# Patient Record
Sex: Male | Born: 1960 | Race: Black or African American | Hispanic: No | Marital: Married | Smoking: Current every day smoker
Health system: Southern US, Community
[De-identification: ages and names within clinical notes are randomized; demographics above are authoritative.]

---

## 2011-07-17 ENCOUNTER — Encounter (HOSPITAL_COMMUNITY): Payer: Self-pay | Admitting: Emergency Medicine

## 2011-07-17 ENCOUNTER — Emergency Department (HOSPITAL_COMMUNITY)
Admission: EM | Admit: 2011-07-17 | Discharge: 2011-07-17 | Disposition: A | Discharge status: Home / Self Care | Payer: Self-pay | Attending: Emergency Medicine | Admitting: Emergency Medicine

## 2011-07-17 ENCOUNTER — Emergency Department (HOSPITAL_COMMUNITY): Payer: Self-pay

## 2011-07-17 DIAGNOSIS — J3489 Other specified disorders of nose and nasal sinuses: Secondary | ICD-10-CM | POA: Insufficient documentation

## 2011-07-17 DIAGNOSIS — F17200 Nicotine dependence, unspecified, uncomplicated: Secondary | ICD-10-CM | POA: Insufficient documentation

## 2011-07-17 DIAGNOSIS — R918 Other nonspecific abnormal finding of lung field: Secondary | ICD-10-CM | POA: Insufficient documentation

## 2011-07-17 DIAGNOSIS — R5383 Other fatigue: Secondary | ICD-10-CM | POA: Insufficient documentation

## 2011-07-17 DIAGNOSIS — R059 Cough, unspecified: Secondary | ICD-10-CM | POA: Insufficient documentation

## 2011-07-17 DIAGNOSIS — R5381 Other malaise: Secondary | ICD-10-CM | POA: Insufficient documentation

## 2011-07-17 DIAGNOSIS — R05 Cough: Secondary | ICD-10-CM | POA: Insufficient documentation

## 2011-07-17 DIAGNOSIS — J189 Pneumonia, unspecified organism: Secondary | ICD-10-CM | POA: Insufficient documentation

## 2011-07-17 LAB — POCT I-STAT, CHEM 8
Calcium, Ion: 1.16 mmol/L (ref 1.12–1.32)
Chloride: 104 mEq/L (ref 96–112)
Glucose, Bld: 98 mg/dL (ref 70–99)
HCT: 38 % — ABNORMAL LOW (ref 39.0–52.0)
Hemoglobin: 12.9 g/dL — ABNORMAL LOW (ref 13.0–17.0)
TCO2: 27 mmol/L (ref 0–100)

## 2011-07-17 MED ORDER — LEVOFLOXACIN 750 MG PO TABS
750.0000 mg | ORAL_TABLET | ORAL | Status: AC
  Administered 2011-07-17: 750 mg via ORAL
  Filled 2011-07-17: qty 1

## 2011-07-17 MED ORDER — LEVOFLOXACIN 750 MG PO TABS: 750.0000 mg | ORAL_TABLET | Freq: Every day | ORAL | Status: AC

## 2011-07-17 MED ORDER — ALBUTEROL SULFATE HFA 108 (90 BASE) MCG/ACT IN AERS
2.0000 | INHALATION_SPRAY | RESPIRATORY_TRACT | Status: AC
  Administered 2011-07-17: 2 via RESPIRATORY_TRACT
  Filled 2011-07-17: qty 6.7

## 2011-07-17 MED ORDER — SODIUM CHLORIDE 0.9 % IV SOLN
INTRAVENOUS | Status: DC
  Administered 2011-07-17: 14:00:00 via INTRAVENOUS

## 2011-07-17 NOTE — ED Provider Notes (Signed)
History   This chart was scribed for Laray Anger, DO by Melba Coon. The patient was seen in room STRE3/STRE3 and the patient's care was started at 1250.   CSN: [id number]  Arrival date & time 07/17/11  1215   None     Chief Complaint  Patient presents with  . URI     HPI Adam Roth is a 51 y.o. male who presents to the Emergency Department complaining of gradual onset and persistence of constant "cough" for the past several days.  Describes the cough as productive of "yellow" sputum.  Has been assoc with runny/stuffy nose and subjective home fevers/chills.  Denies CP/palpitations, no SOB, no rash, no back pain, no abd pain.     History reviewed. No pertinent past medical history.  History reviewed. No pertinent past surgical history.   History  Substance Use Topics  . Smoking status: Current Everyday Smoker  . Smokeless tobacco: Not on file  . Alcohol Use: Yes     occasional    Review of Systems 10 Systems reviewed and all are negative for acute change except as noted in the HPI. ROS: Statement: All systems negative except as marked or noted in the HPI; Constitutional:  +chills. ; ; Eyes: Negative for eye pain, redness and discharge. ; ; ENMT: Negative for ear pain, hoarseness, sinus pressure and sore throat. +runny/stuffy nose. ; ; Cardiovascular: Negative for chest pain, palpitations, diaphoresis, dyspnea and peripheral edema. ; ; Respiratory: +cough.  Negative for wheezing and stridor. ; ; Gastrointestinal: Negative for nausea, vomiting, diarrhea, abdominal pain, blood in stool, hematemesis, jaundice and rectal bleeding. . ; ; Genitourinary: Negative for dysuria, flank pain and hematuria. ; ; Musculoskeletal: Negative for back pain and neck pain. Negative for swelling and trauma.; ; Skin: Negative for pruritus, rash, abrasions, blisters, bruising and skin lesion.; ; Neuro: Negative for headache, lightheadedness and neck stiffness. Negative for weakness, altered level  of consciousness , altered mental status, extremity weakness, paresthesias, involuntary movement, seizure and syncope.     Allergies  Review of patient's allergies indicates no known allergies.  Home Medications  No current outpatient prescriptions on file.  BP 115/64  Pulse 92  Temp(Src) 99.9 F (37.7 C) (Oral)  Resp 18  SpO2 98%  Physical Exam 1255: Physical examination:  Nursing notes reviewed; Vital signs and O2 SAT reviewed;  Constitutional: Well developed, Well nourished, Well hydrated, In no acute distress; Head:  Normocephalic, atraumatic; Eyes: EOMI, PERRL, No scleral icterus; ENMT: TM's clear.  +edemetous nasal turbinates bilat with clear rhinorrhea., Mouth and pharynx normal, Mucous membranes moist; Neck: Supple, Full range of motion, No lymphadenopathy; Cardiovascular: Regular rate and rhythm, No gallop; Respiratory: Breath sounds clear & equal bilaterally, Speaking full sentences with ease. No wheezes, Normal respiratory effort/excursion; Chest: Nontender, Movement normal; Extremities: Pulses normal, No tenderness, No edema, No calf edema or asymmetry.; Neuro: AA&Ox3, Major CN grossly intact. Speech clear, no facial droop.  No gross focal motor or sensory deficits in extremities.; Skin: Color normal, Warm, Dry.    ED Course  Procedures    MDM  MDM Reviewed: nursing note, vitals and previous chart Interpretation: x-ray   Results for orders placed during the hospital encounter of 07/17/11  POCT I-STAT, CHEM 8      Component Value Range   Sodium 140  135 - 145 (mEq/L)   Potassium 3.8  3.5 - 5.1 (mEq/L)   Chloride 104  96 - 112 (mEq/L)   BUN 14  6 - 23 (mg/dL)  Creatinine, Ser 1.40 (*) 0.50 - 1.35 (mg/dL)   Glucose, Bld 98  70 - 99 (mg/dL)   Calcium, Ion 4.09  8.11 - 1.32 (mmol/L)   TCO2 27  0 - 100 (mmol/L)   Hemoglobin 12.9 (*) 13.0 - 17.0 (g/dL)   HCT 91.4 (*) 78.2 - 52.0 (%)   Dg Chest 2 View 07/17/2011  *RADIOLOGY REPORT*  Clinical Data: Productive cough,  congestion, weakness  CHEST - 2 VIEW  Comparison: None.  Findings: The lungs are somewhat hyperaerated.  There is mild peribronchial thickening which may indicate bronchitis.  There is a subtle opacity at the left lung base lying between the anterior left sixth and seventh ribs.  A lung nodule cannot be excluded and CT of the chest is recommended.  Mediastinal contours are within normal limits.  The heart is normal in size.  No bony abnormality is seen.  IMPRESSION:  1.  No active lung disease.  Slight hyperaeration.  Mild peribronchial thickening. 2.  Cannot exclude subtle nodular opacity at the left lung base. Consider CT chest without contrast to assess further.  Original Report Authenticated By: Juline Patch, M.D.    Ct Chest Wo Contrast 07/17/2011  *RADIOLOGY REPORT*  Clinical Data: Abnormal chest x-ray.  CT CHEST WITHOUT CONTRAST  Technique:  Multidetector CT imaging of the chest was performed following the standard protocol without IV contrast.  Comparison: 07/17/2011  Findings: There are scattered ground-glass nodular densities within the right upper lobe, right middle lobe and both lower lobes.  The nodular densities seen on chest x-ray in the left lower lobe correspond to the nodular ground-glass densities.  I suspect these are infectious or inflammatory as they do not have the typical neoplastic appearance.  No pleural effusions.  Small bilateral axillary and mediastinal lymph nodes, none pathologically enlarged. Heart is normal size. Aorta is normal caliber.  There is mild bilateral gynecomastia.  Chest wall soft tissues otherwise unremarkable. Imaging into the upper abdomen shows no acute findings.  No acute bony abnormality.  IMPRESSION: Scattered ground-glass nodular densities in the lungs as described above, most pronounced in the left lower lobe as seen on prior chest x-ray.  I favor this represents infectious or inflammatory process.  Original Report Authenticated By: Cyndie Chime, M.D.      3:57 PM:   MDI teach and treat given; 1st dose abx for CAP given.  VS remain stable, afebrile, resps easy, Sat 100% R/A.  Wants to go home now.  Dx testing d/w pt and family.  Questions answered.  Verb understanding, agreeable to d/c home with outpt f/u.       I personally performed the services described in this documentation, which was scribed in my presence. The recorded information has been reviewed and considered. Khye Hochstetler Allison Quarry, DO 07/20/11 1551

## 2011-07-17 NOTE — Discharge Instructions (Signed)
RESOURCE GUIDE  Dental Problems  Patients with Medicaid: [facility]                     [facility] [address]                                           [address] Phone:  [phone]                                                  Phone:  [phone]  If unable to pay or uninsured, contact:  [facility] or [facility] to become qualified for the adult dental clinic.  Chronic Pain Problems Contact [facility]  [phone] Patients need to be referred by their primary care doctor.  Insufficient Money for Medicine Contact [employer]:  call "211" or [facility] 271-5999.  No Primary Care Doctor Call [facility]  832-8000 Other agencies that provide inexpensive medical care    [facility]  [phone]    [facility]  [phone]    [facility]  [phone]    Women's Clinic  [phone]    [facility]  [phone]    [facility]  [phone]  Psychological Services [facility]  [phone] [facility]  [phone] [facility]   [phone] (emergency services [phone])  Substance Abuse Resources [facility]  336-882-2125 [facility] 336-784-9470 [facility] 336-285-9073 [facility] 336-845-3988 Residential & Outpatient Substance Abuse Program  800-659-3381  Abuse/Neglect [place] Child Abuse Hotline (336) 641-3795 [place] Child Abuse Hotline 800-378-5315 (After Hours)  Emergency Shelter [facility] (336) 271-5985  Maternity Homes [facility] (336) 275-9566 [facility] (704) 372-4663  MRSA Hotline #:   832-7006    [place] [facility]  [facility] [place]     [employer]                          [facility] [address] [place]                       [address]      [address]  [place]                                                [place]                            [place] Phone:  [phone]                                   Phone:  [phone]                 Phone:  [phone]  [facility] Phone:  [phone]    [facility] Child Abuse Hotline (612) 604-3532 469-771-6692 (After Hours)   Take the prescription as directed.  Use your albuterol inhaler (2 to 4 puffs) every 4 hours for the next 7 days, then as needed for cough, wheezing, or shortness of breath.  Try to stop smoking.  Call your regular medical doctor today to schedule a follow up appointment within the next 3 days.  Return to the Emergency Department immediately sooner if worsening.

## 2011-07-17 NOTE — ED Notes (Signed)
Pt c/o productive cough with yellow sputum x several days and fever and chills

## 2011-07-17 NOTE — ED Notes (Signed)
Discharge instructions reviewed with pt; verbalizes understanding.  No questions asked; no further c/o's voiced.  Pt ambulatory to lobby without distress.

## 2013-06-01 IMAGING — CR DG CHEST 2V
2 series · 2 of 2 positions shown · non-contrast
Comparison: None.

CLINICAL DATA: Productive cough, congestion, weakness

CHEST - 2 VIEW

[w chest pa]
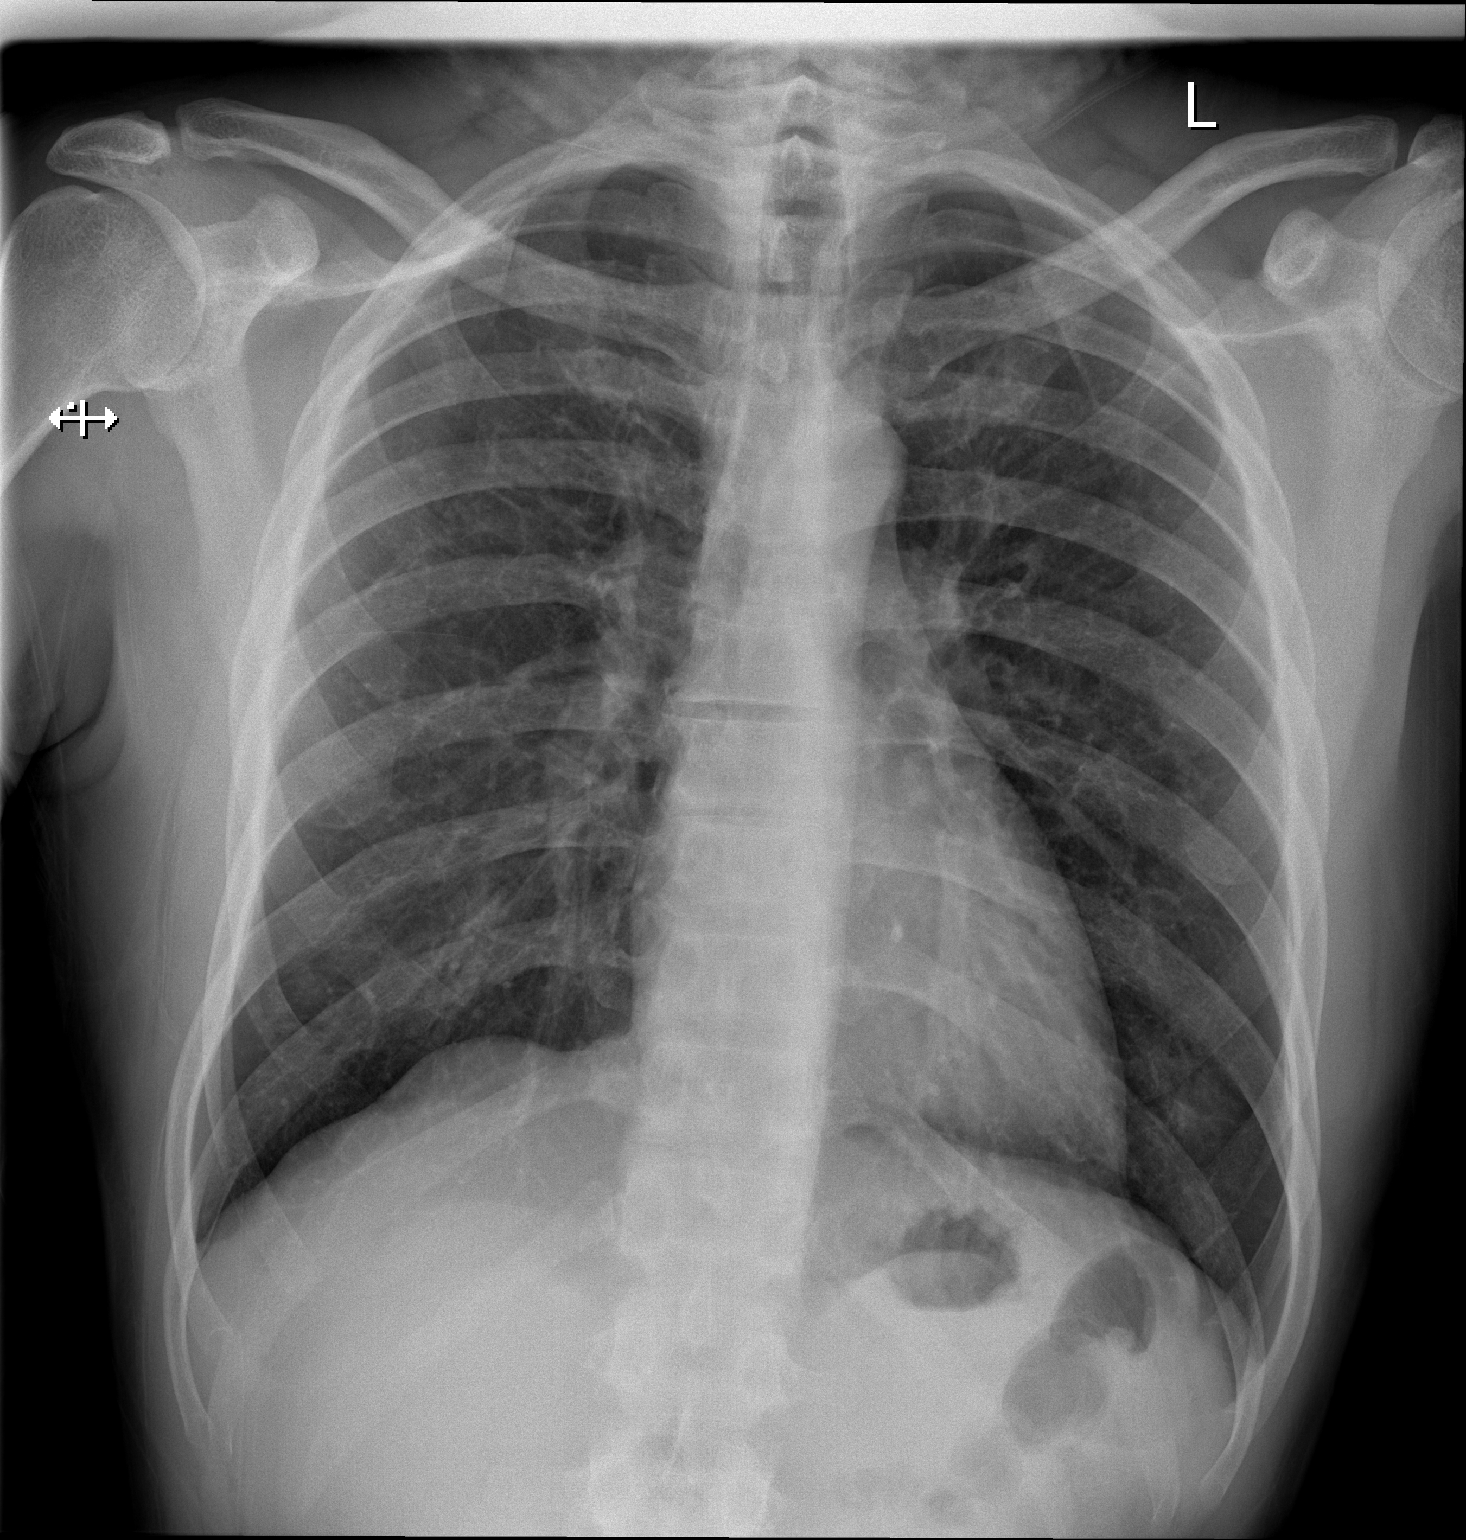

[w chest lat]
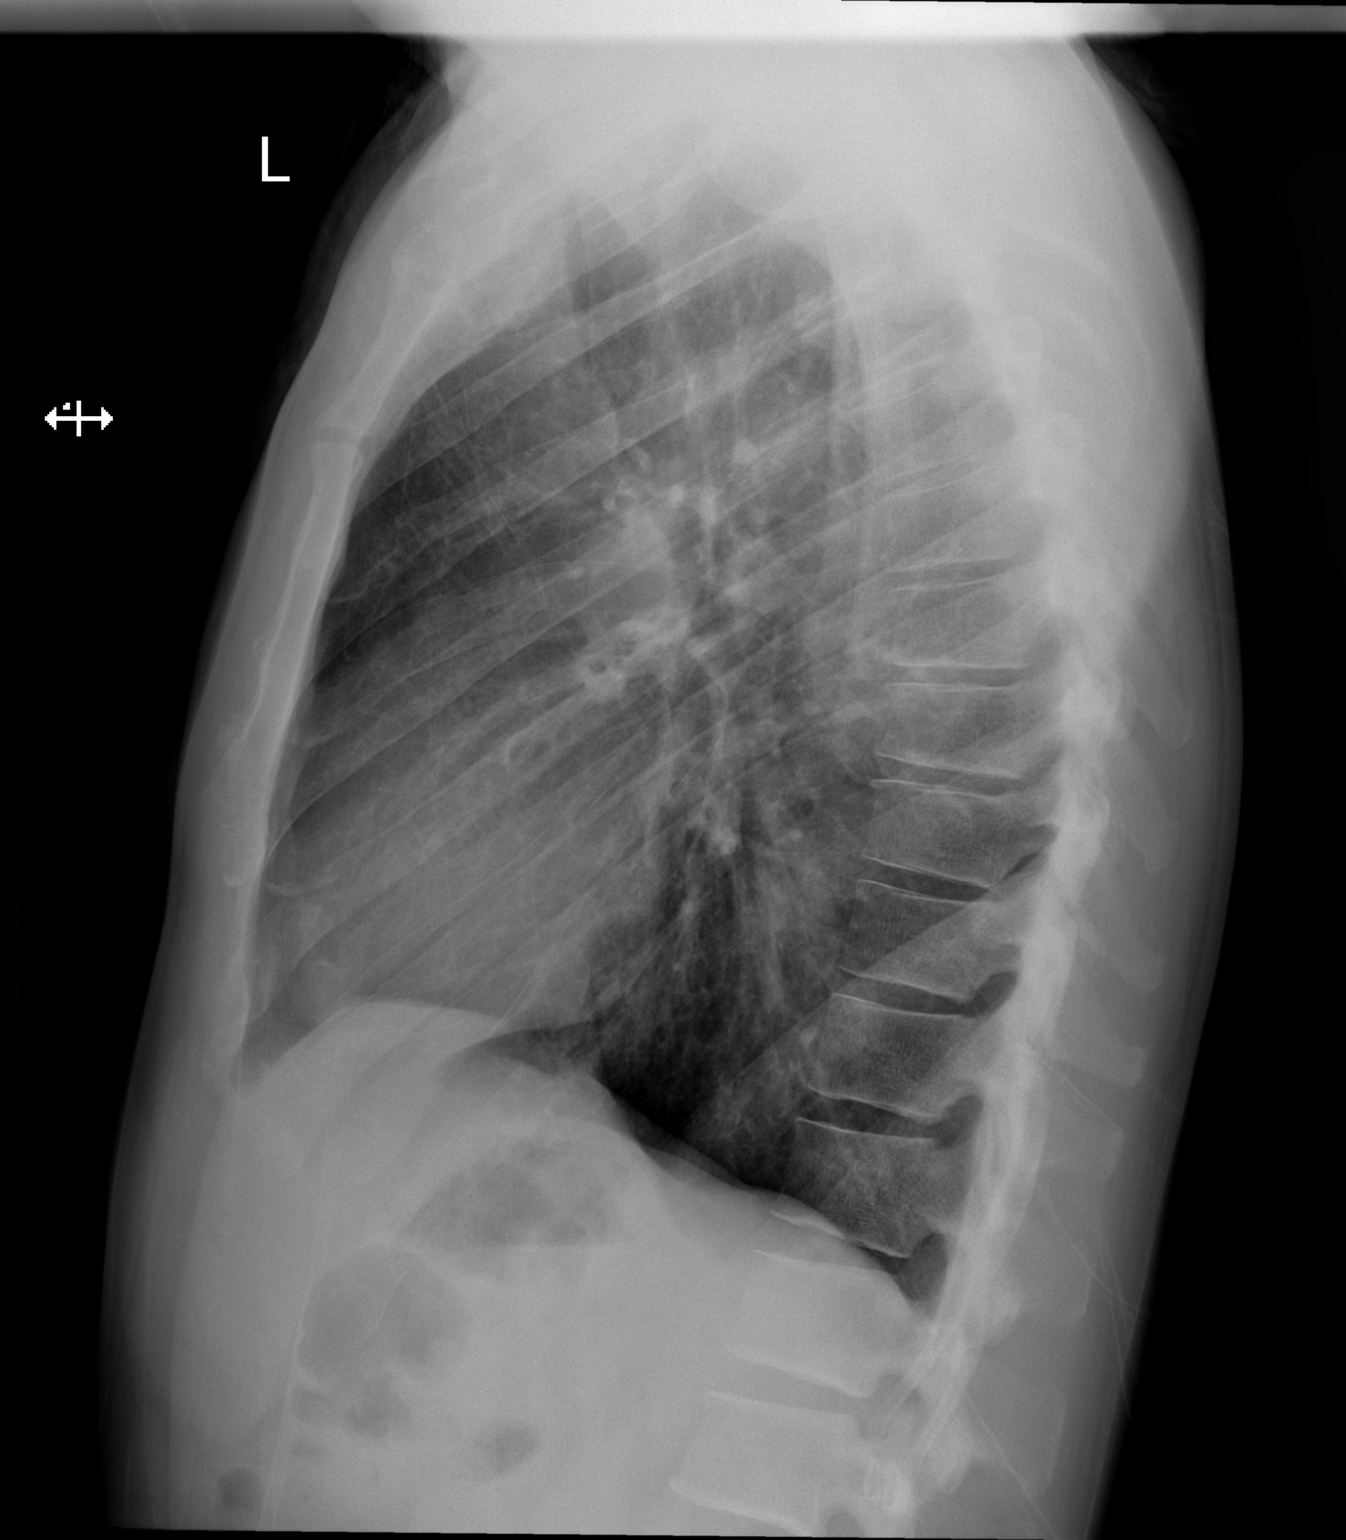

[2 of 2 positions shown; findings below may reference images not displayed]

FINDINGS: The lungs are somewhat hyperaerated.  There is mild
peribronchial thickening which may indicate bronchitis.  There is a
subtle opacity at the left lung base lying between the anterior
left sixth and seventh ribs.  A lung nodule cannot be excluded and
CT of the chest is recommended.  Mediastinal contours are within
normal limits.  The heart is normal in size.  No bony abnormality
is seen.
IMPRESSION: 1.  No active lung disease.  Slight hyperaeration.  Mild
peribronchial thickening.
2.  Cannot exclude subtle nodular opacity at the left lung base.
Consider CT chest without contrast to assess further.

## 2013-08-07 ENCOUNTER — Emergency Department (HOSPITAL_COMMUNITY)
Admission: EM | Admit: 2013-08-07 | Discharge: 2013-08-08 | Disposition: A | Discharge status: Home / Self Care | Payer: Self-pay | Attending: Emergency Medicine | Admitting: Emergency Medicine

## 2013-08-07 ENCOUNTER — Encounter (HOSPITAL_COMMUNITY): Payer: Self-pay | Admitting: Emergency Medicine

## 2013-08-07 DIAGNOSIS — F17200 Nicotine dependence, unspecified, uncomplicated: Secondary | ICD-10-CM | POA: Insufficient documentation

## 2013-08-07 DIAGNOSIS — R109 Unspecified abdominal pain: Secondary | ICD-10-CM

## 2013-08-07 LAB — CBC WITH DIFFERENTIAL/PLATELET
BASOS PCT: 1 % (ref 0–1)
Basophils Absolute: 0.1 10*3/uL (ref 0.0–0.1)
EOS PCT: 4 % (ref 0–5)
Eosinophils Absolute: 0.2 10*3/uL (ref 0.0–0.7)
HCT: 34.5 % — ABNORMAL LOW (ref 39.0–52.0)
HEMOGLOBIN: 11.2 g/dL — AB (ref 13.0–17.0)
LYMPHS ABS: 2.9 10*3/uL (ref 0.7–4.0)
Lymphocytes Relative: 51 % — ABNORMAL HIGH (ref 12–46)
MCH: 20.6 pg — AB (ref 26.0–34.0)
MCHC: 32.5 g/dL (ref 30.0–36.0)
MCV: 63.3 fL — AB (ref 78.0–100.0)
MONO ABS: 0.4 10*3/uL (ref 0.1–1.0)
Monocytes Relative: 8 % (ref 3–12)
NEUTROS PCT: 36 % — AB (ref 43–77)
Neutro Abs: 2 10*3/uL (ref 1.7–7.7)
Platelets: 282 10*3/uL (ref 150–400)
RBC: 5.45 MIL/uL (ref 4.22–5.81)
RDW: 16.6 % — ABNORMAL HIGH (ref 11.5–15.5)
WBC: 5.6 10*3/uL (ref 4.0–10.5)

## 2013-08-07 LAB — COMPREHENSIVE METABOLIC PANEL
ALT: 19 U/L (ref 0–53)
AST: 23 U/L (ref 0–37)
Albumin: 3.8 g/dL (ref 3.5–5.2)
Alkaline Phosphatase: 68 U/L (ref 39–117)
BILIRUBIN TOTAL: 0.4 mg/dL (ref 0.3–1.2)
BUN: 11 mg/dL (ref 6–23)
CALCIUM: 9.2 mg/dL (ref 8.4–10.5)
CO2: 24 meq/L (ref 19–32)
Chloride: 104 mEq/L (ref 96–112)
Creatinine, Ser: 0.98 mg/dL (ref 0.50–1.35)
GLUCOSE: 89 mg/dL (ref 70–99)
Potassium: 4 mEq/L (ref 3.7–5.3)
SODIUM: 139 meq/L (ref 137–147)
Total Protein: 7 g/dL (ref 6.0–8.3)

## 2013-08-07 LAB — URINALYSIS, ROUTINE W REFLEX MICROSCOPIC
BILIRUBIN URINE: NEGATIVE
Glucose, UA: NEGATIVE mg/dL
HGB URINE DIPSTICK: NEGATIVE
KETONES UR: NEGATIVE mg/dL
Leukocytes, UA: NEGATIVE
Nitrite: NEGATIVE
PROTEIN: NEGATIVE mg/dL
Specific Gravity, Urine: 1.018 (ref 1.005–1.030)
UROBILINOGEN UA: 1 mg/dL (ref 0.0–1.0)
pH: 6.5 (ref 5.0–8.0)

## 2013-08-07 LAB — LIPASE, BLOOD: Lipase: 46 U/L (ref 11–59)

## 2013-08-07 MED ORDER — IBUPROFEN 400 MG PO TABS: 400.0000 mg | ORAL_TABLET | Freq: Four times a day (QID) | ORAL | Status: AC | PRN

## 2013-08-07 NOTE — Discharge Instructions (Signed)
Flank Pain °Flank pain refers to pain that is located on the side of the body between the upper abdomen and the back. The pain may occur over a short period of time (acute) or may be long-term or reoccurring (chronic). It may be mild or severe. Flank pain can be caused by many things. °CAUSES  °Some of the more common causes of flank pain include: °· Muscle strains.   °· Muscle spasms.   °· A disease of your spine (vertebral disk disease).   °· A lung infection (pneumonia).   °· Fluid around your lungs (pulmonary edema).   °· A kidney infection.   °· Kidney stones.   °· A very painful skin rash caused by the chickenpox virus (shingles).   °· Gallbladder disease.   °HOME CARE INSTRUCTIONS  °Home care will depend on the cause of your pain. In general, °· Rest as directed by your caregiver. °· Drink enough fluids to keep your urine clear or pale yellow. °· Only take over-the-counter or prescription medicines as directed by your caregiver. Some medicines may help relieve the pain. °· Tell your caregiver about any changes in your pain. °· Follow up with your caregiver as directed. °SEEK IMMEDIATE MEDICAL CARE IF:  °· Your pain is not controlled with medicine.   °· You have new or worsening symptoms. °· Your pain increases.   °· You have abdominal pain.   °· You have shortness of breath.   °· You have persistent nausea or vomiting.   °· You have swelling in your abdomen.   °· You feel faint or pass out.   °· You have blood in your urine. °· You have a fever or persistent symptoms for more than 2 3 days. °· You have a fever and your symptoms suddenly get worse. °MAKE SURE YOU:  °· Understand these instructions. °· Will watch your condition. °· Will get help right away if you are not doing well or get worse. °Document Released: 04/16/2005 Document Revised: 11/18/2011 Document Reviewed: 10/08/2011 °ExitCare® Patient Information ©2014 [employer]. ° ° °Emergency Department Resource Guide °1) Find a Doctor and Pay Out of  Pocket °Although you won't have to find out who is covered by your insurance plan, it is a good idea to ask around and get recommendations. You will then need to call the office and see if the doctor you have chosen will accept you as a new patient and what types of options they offer for patients who are self-pay. Some doctors offer discounts or will set up payment plans for their patients who do not have insurance, but you will need to ask so you aren't surprised when you get to your appointment. ° °2) Contact Your Local Health Department °Not all health departments have doctors that can see patients for sick visits, but many do, so it is worth a call to see if yours does. If you don't know where your local health department is, you can check in your phone book. The CDC also has a tool to help you locate your state's health department, and many state websites also have listings of all of their local health departments. ° °3) Find a Walk-in Clinic °If your illness is not likely to be very severe or complicated, you may want to try a walk in clinic. These are popping up all over the country in pharmacies, drugstores, and shopping centers. They're usually staffed by nurse practitioners or physician assistants that have been trained to treat common illnesses and complaints. They're usually fairly quick and inexpensive. However, if you have serious medical issues or chronic   medical problems, these are probably not your best option. ° °No Primary Care Doctor: °- Call [facility] at  832-8000 - they can help you locate a primary care doctor that  accepts your insurance, provides certain services, etc. °- Physician Referral Service- 1-800-533-3463 ° °Chronic Pain Problems: °Organization         Address  Phone   Notes  °[facility]  (336) 297-2271 Patients need to be referred by their primary care doctor.  ° °Medication Assistance: °Organization         Address  Phone   Notes  °[place]  [facility] [address] °[place], [place] [address] (336) 641-8030 --Must be a resident of [place] °-- Must have NO insurance coverage whatsoever (no Medicaid/ Medicare, etc.) °-- The pt. MUST have a primary care doctor that directs their care regularly and follows them in the community °  °[facility]  (866) 331-1348   °[employer]  (888) 892-1162   ° °Agencies that provide inexpensive medical care: °Organization         Address  Phone   Notes  °[facility]  (336) 832-8035   °[facility]    (336) 832-7272   °[facility] [address] °[place], [place] [address] (336) 832-4777   °[facility] [address], °[place] (336) 271-4999   °[facility]    (336) 373-0678   °[facility]    (336) 272-1050   °[facility] ° [address], [place] Phone:  (336) 832-4444, Fax:  (336) 832-4440 Hours of Operation:  9 am - 6 pm, M-F.  Also accepts Medicaid/Medicare and self-pay.  °[facility] ° [address], [place] Phone: (336) 832-3150, Fax: (336) 832-3151. Hours of Operation:  8:30 am - 5:30 pm, M-F.  Also accepts Medicaid and self-pay.  °[facility] [place] [address], [place] Phone: (336) 878-6027   °[facility] [address], [place],  (336)723-1848, Ext. 123 Mondays & Thursdays: 7-9 AM.  First 15 patients are seen on a first come, first serve basis. °  ° °Medicaid-accepting [place] Providers: ° °Organization         Address  Phone   Notes  °[facility] [address], [place] (336) 641-2100 Also accepts self-pay patients.  °[facility] [address], [place] ° (336) 856-9996   °[facility] [address], [place] (336) 288-8857   °[facility] [address] [place]  [address], [place] (336) 299-7000   °Veita Bland [address], [place]  ° (336) 373-1557 Only accepts [place] Medicaid patients after they have their name applied to their card.  ° °Self-Pay (no insurance) in [place]: ° °Organization         Address  Phone   Notes  °Sickle Cell Patients, [facility] [address], [place] (336) 832-1970   °[facility] [address], [place] (336) 832-4400   °[facility] ° [address],  (336) 992-4800   °[facility]/Dr. Osei-Bonsu ° [address] [place] [address], [place] or [address], [place] (336) 841-8500 Phone number for both [place] and  locations is the same.  °[facility] [address], [place] (336) 299-0000   °[facility] [address] [place] [address], [place] or [address] (336) 852-7530 °(336) 878-2260   °[facility] [address], [place] (336) 350-1642, phone; (336) 294-5005, fax Sees patients 1st and 3rd Saturday of every month.  Must not qualify for public or private insurance (i.e. Medicaid, Medicare, [place] Health Choice, Veterans' Benefits) • Household income should be no more than 200% of the poverty level •The clinic cannot treat you if you are pregnant or think you are pregnant • Sexually transmitted diseases are not treated at the clinic.  ° ° °Dental Care: °Organization         Address  Phone  Notes  °[place] [facility] [address], [place] (336) 641-6152 Accepts children up to age 21 who are enrolled in Medicaid or [place] Health Choice; pregnant women with a Medicaid card; and children who have applied for Medicaid or [place] Health Choice, but were declined, whose parents can pay a reduced fee at time of service.  °[facility] [place]  [address], [place]  (336) 641-7733 Accepts children up to age 21 who are enrolled in Medicaid or [place] Health Choice; pregnant women with a Medicaid card; and children who have applied for Medicaid or [place] Health Choice, but were declined, whose parents can pay a reduced fee at time of service.  °[facility] ° [address], [place] (336) 641-4533 Patients are seen by appointment only. Walk-ins are not accepted. [facility] will see patients 18 years of age and older. °Monday - Tuesday (8am-5pm) °Most Wednesdays (8:30-5pm) °$30 per visit, cash only  °[facility] ° [address], [place] (336) 641-4533 Patients are seen by appointment only. Walk-ins are not accepted. [facility] will see patients 18 years of age and older. °One Wednesday Evening (Monthly: Volunteer Based).  $30 per visit, cash only  °[facility]  (919) 537-3737 for adults; Children under age 4, call Graduate Pediatric Dentistry at (919) [phone]. Children aged 4-14, please call (919) [phone] to request a pediatric application. ° Dental services are provided in all areas of dental care including fillings, crowns and bridges, complete and partial dentures, implants, gum treatment, root canals, and extractions. Preventive care is also provided. Treatment is provided to both adults and children. °Patients are selected via a lottery and there is often a waiting list. °  °[facility] [address], °[place] ° (336) 763-8833 [email or web address] °  °[facility] [address], [place], [place] (336)723-1848, Ext. 123 Second and Fourth Thursday of each month, opens at 6:30 AM; Clinic ends at 9 AM.  Patients are seen on a first-come first-served basis, and a limited number are seen during each clinic.  ° °[facility] ° [address], [place], [place] (336) 723-7904   Eligibility Requirements °You must have lived in [place], [place], or [place]  counties for at least the last three months. °  You cannot be eligible for state or federal sponsored healthcare insurance, including Veterans Administration, Medicaid, or Medicare. °  You generally cannot be eligible for healthcare insurance through your employer.  °  How to apply: °Eligibility screenings are held every Tuesday and Wednesday afternoon from 1:00 pm until 4:00 pm. You do not need an appointment for the interview!  °  [facility] [address], [place], [place] 336-631-2330   °[facility]  336-342-8273   °[place]  336-703-3100   °[facility]  336-570-6415   ° °Behavioral Health Resources in the Community: °Intensive Outpatient Programs °Organization         Address  Phone  Notes  °[facility] [address], [place], [place] 336-878-6098   °[facility] [address], [place], [place] 336-832-9800   °[facility] [address], [place], [place] ° 336-882-2125   °[facility] [address],  °[place], [place] 1-800-853-5163 or 336-641-4981   °Substance Abuse Resources °Organization         Address  Phone  Notes  °[facility]  336-882-2125   °[facility]  336-784-9470   °[facility]  336-285-9073   °[facility]  336-845-3988   °Residential & Outpatient Substance Abuse Program  1-800-659-3381   °Psychological Services °Organization         Address  Phone  Notes  °[facility]  336- 832-9600   °[facility]  336- 378-7881   °[facility] [address], [place] 1-800-853-5163 or 336-641-4981   ° °Mobile Crisis Teams °Organization         Address  Phone  Notes  °[facility], Mobile Crisis Care Unit  1-877-626-1772   °Assertive °[facility] ° [address] [place], [place] 336-834-9664   °Sharon DeEsch [address] °[place]  [place] 336-554-5454   ° °Self-Help/Support Groups °Organization         Address  Phone             Notes  °[facility] [place] - variety of support groups  336- 373-1402 Call for more information  °Narcotics Anonymous (NA), [facility] [address], °[place]  2 meetings at this location  ° °Residential Treatment Programs °Organization         Address  Phone  Notes  °[facility] [address],    °[place]  1-866-801-8205   °[facility] ° [address], [place], [place] 704-293-8524   °[facility] [address], [place] 336-845-3988 Admissions: 8am-3pm M-F  °[facility] [address]    °[place], [place] 336-841-1104   °[facility] [address], [place], [place] 336-379-7146   °[facility] [address]  °[place], [place] 336-285-9073   °[facility] - Intensive Outpatient [address], [place], [place] 336-852-3033   °[facility] [address]  °[place], [place] 1-877-615-2722 or 336-784-9470   °[facility] [address] [place], [place] 336-227-7417 Accepts Medicaid  °[facility] [address]  °[place] 1-800-659-3381 Substance Abuse/Addiction Treatment  ° °[place] Behavioral Health Resources °Organization         Address  Phone  Notes  °[facility]  (888) 581-9988   °Julie Brannon, PhD [address] [place], [place]   (336) 349-5553 or (336) 951-0000   °[facility]   [address] °[place], [place] (336) 349-4454   °[facility] [address], [place], [place] (336) 342-8316 Insurance/Medicaid/sponsorship through [facility]  °[facility] [address]                                      [place], [place] (336) 342-8316 Therapy/tele-psych/case  °[facility] [address]  ° [place], [place] (336) 349-2233    °Dr. Arfeen  (336)  349-4544   °[facility] [place]  [employer] [place] [facility] 1) [address], [place] °2) [address], [place] °3)  [address], [place] (336) 349-3220 °(336) 342-7768 ° °(336) 342-8140   °[place] [facility] (336) 342-1394 or (336) 342-3537 (After Hours)    ° ° ° ° °

## 2013-08-07 NOTE — ED Notes (Signed)
Pt reports 10/10 left sided flank pain radiating to left lower abdomen x 1 month. Denies N/V/D. Denies dysuria, hematuria or frequency. NAD. AO x4.

## 2013-08-08 NOTE — ED Provider Notes (Signed)
CSN: [id number]     Arrival date & time 08/07/13  2012 History   First MD Initiated Contact with Patient 08/07/13 2339     Chief Complaint  Patient presents with  . Flank Pain     (Consider location/radiation/quality/duration/timing/severity/associated sxs/prior Treatment) HPI Comments: Patient is an otherwise healthy 53 year old male who presents to the ED tonight with chief complaint of intermittent flank pain x 1 year. He states there is nothing that seems to trigger this pain. When it comes on it has sudden onset. The patient can only describe it by saying "boom!". It then resolves spontaneously. He is not currently having the pain and feels well. There is no associated dysuria, urinary urgency, urinary frequency, nausea, vomiting, abdominal pain, chest pain, shortness of breath. He states "I have not had a physical in a long time and I need to get checked out".   The history is provided by the patient. No language interpreter was used.    History reviewed. No pertinent past medical history. History reviewed. No pertinent past surgical history. No family history on file. History  Substance Use Topics  . Smoking status: Current Every Day Smoker -- 1.00 packs/day    Types: Cigarettes  . Smokeless tobacco: Not on file  . Alcohol Use: Yes     Comment: occasional    Review of Systems  Constitutional: Negative for fever and chills.  Respiratory: Negative for shortness of breath.   Cardiovascular: Negative for chest pain.  Gastrointestinal: Negative for vomiting, abdominal pain and diarrhea.  Genitourinary: Positive for flank pain. Negative for dysuria, frequency, penile swelling, difficulty urinating and testicular pain.  All other systems reviewed and are negative.     Allergies  Review of patient's allergies indicates no known allergies.  Home Medications   Prior to Admission medications   Medication Sig Start Date End Date Taking? Authorizing Provider  ibuprofen  (ADVIL,MOTRIN) 400 MG tablet Take 1 tablet (400 mg total) by mouth every 6 (six) hours as needed (as needed for pain, no more than every 6 hours). 08/07/13   Mora Bellman, PA-C   BP 123/81  Pulse 62  Temp(Src) 97.5 F (36.4 C) (Oral)  Resp 18  Ht 5\' 8"  (1.727 m)  Wt 158 lb 3 oz (71.753 kg)  BMI 24.06 kg/m2  SpO2 100% Physical Exam  Nursing note and vitals reviewed. Constitutional: He is oriented to person, place, and time. He appears well-developed and well-nourished. No distress.  HENT:  Head: Normocephalic and atraumatic.  Right Ear: External ear normal.  Left Ear: External ear normal.  Nose: Nose normal.  Eyes: Conjunctivae are normal.  Neck: Normal range of motion. No tracheal deviation present.  Cardiovascular: Normal rate, regular rhythm and normal heart sounds.   Pulmonary/Chest: Effort normal and breath sounds normal. No stridor.  Abdominal: Soft. He exhibits no distension. There is no tenderness. There is no rigidity, no rebound, no guarding and no CVA tenderness.  Musculoskeletal: Normal range of motion.  Neurological: He is alert and oriented to person, place, and time.  Skin: Skin is warm and dry. He is not diaphoretic.  Psychiatric: He has a normal mood and affect. His behavior is normal.    ED Course  Procedures (including critical care time) Labs Review Labs Reviewed  CBC WITH DIFFERENTIAL - Abnormal; Notable for the following:    Hemoglobin 11.2 (*)    HCT 34.5 (*)    MCV 63.3 (*)    MCH 20.6 (*)    RDW 16.6 (*)  Neutrophils Relative % 36 (*)    Lymphocytes Relative 51 (*)    All other components within normal limits  COMPREHENSIVE METABOLIC PANEL  LIPASE, BLOOD  URINALYSIS, ROUTINE W REFLEX MICROSCOPIC    Imaging Review No results found.   EKG Interpretation None      MDM   Final diagnoses:  Flank pain    Patient presents to ED for flank pain x 1 year. Asymptomatic at this time. He appears well. Labs and vitals reassuring. Patient  given a resource guide to establish care with PCP so he can get his physical. No concern for emergent pathology at this time. Return instructions given. Vital signs stable for discharge. Patient / Family / Caregiver informed of clinical course, understand medical decision-making process, and agree with plan.     Mora BellmanHannah S Bianney [place], PA-C 08/08/13 1440

## 2013-08-10 NOTE — ED Provider Notes (Signed)
Medical screening examination/treatment/procedure(s) were performed by non-physician practitioner and as supervising physician I was immediately available for consultation/collaboration.   EKG Interpretation None       Abrie Egloff M Zahira Brummond, MD 08/10/13 1803
